# Patient Record
Sex: Female | Born: 1935 | Race: Black or African American | Hispanic: No
Health system: Southern US, Community
[De-identification: ages and names within clinical notes are randomized; demographics above are authoritative.]

## PROBLEM LIST (undated history)

## (undated) DIAGNOSIS — K219 Gastro-esophageal reflux disease without esophagitis: Secondary | ICD-10-CM

## (undated) DIAGNOSIS — F329 Major depressive disorder, single episode, unspecified: Secondary | ICD-10-CM

## (undated) DIAGNOSIS — I10 Essential (primary) hypertension: Secondary | ICD-10-CM

## (undated) DIAGNOSIS — F0390 Unspecified dementia without behavioral disturbance: Secondary | ICD-10-CM

## (undated) DIAGNOSIS — F32A Depression, unspecified: Secondary | ICD-10-CM

---

## 2012-02-12 ENCOUNTER — Encounter (HOSPITAL_COMMUNITY): Payer: Self-pay | Admitting: Emergency Medicine

## 2012-02-12 ENCOUNTER — Other Ambulatory Visit: Payer: Self-pay

## 2012-02-12 ENCOUNTER — Emergency Department (HOSPITAL_COMMUNITY)
Admission: EM | Admit: 2012-02-12 | Discharge: 2012-02-13 | Disposition: A | Discharge status: Skilled Nursing Facility | Payer: Medicare Other | Attending: Emergency Medicine | Admitting: Emergency Medicine

## 2012-02-12 ENCOUNTER — Other Ambulatory Visit (HOSPITAL_COMMUNITY): Payer: Self-pay | Admitting: Pharmacy Technician

## 2012-02-12 ENCOUNTER — Emergency Department (HOSPITAL_COMMUNITY): Payer: Medicare Other

## 2012-02-12 DIAGNOSIS — I10 Essential (primary) hypertension: Secondary | ICD-10-CM | POA: Insufficient documentation

## 2012-02-12 DIAGNOSIS — F3289 Other specified depressive episodes: Secondary | ICD-10-CM | POA: Insufficient documentation

## 2012-02-12 DIAGNOSIS — Z79899 Other long term (current) drug therapy: Secondary | ICD-10-CM | POA: Insufficient documentation

## 2012-02-12 DIAGNOSIS — F0390 Unspecified dementia without behavioral disturbance: Secondary | ICD-10-CM

## 2012-02-12 DIAGNOSIS — F068 Other specified mental disorders due to known physiological condition: Secondary | ICD-10-CM | POA: Insufficient documentation

## 2012-02-12 DIAGNOSIS — F329 Major depressive disorder, single episode, unspecified: Secondary | ICD-10-CM | POA: Insufficient documentation

## 2012-02-12 DIAGNOSIS — R45851 Suicidal ideations: Secondary | ICD-10-CM | POA: Insufficient documentation

## 2012-02-12 DIAGNOSIS — Z7982 Long term (current) use of aspirin: Secondary | ICD-10-CM | POA: Insufficient documentation

## 2012-02-12 HISTORY — DX: Unspecified dementia, unspecified severity, without behavioral disturbance, psychotic disturbance, mood disturbance, and anxiety: F03.90

## 2012-02-12 HISTORY — DX: Essential (primary) hypertension: I10

## 2012-02-12 LAB — COMPREHENSIVE METABOLIC PANEL
ALT: 33 U/L (ref 0–35)
Calcium: 9 mg/dL (ref 8.4–10.5)
Creatinine, Ser: 0.92 mg/dL (ref 0.50–1.10)
GFR calc Af Amer: 60 mL/min — ABNORMAL LOW (ref >90)
Glucose, Bld: 100 mg/dL — ABNORMAL HIGH (ref 70–99)
Sodium: 139 mEq/L (ref 135–145)
Total Protein: 7.4 g/dL (ref 6.0–8.3)

## 2012-02-12 LAB — CBC
MCH: 31.2 pg (ref 26.0–34.0)
MCV: 98.3 fL (ref 78.0–100.0)
Platelets: 246 10*3/uL (ref 150–400)
RBC: 3.59 MIL/uL — ABNORMAL LOW (ref 3.87–5.11)
RDW: 12.2 % (ref 11.5–15.5)

## 2012-02-12 LAB — ETHANOL: Alcohol, Ethyl (B): <11 mg/dL (ref 0–11)

## 2012-02-12 LAB — URINALYSIS, ROUTINE W REFLEX MICROSCOPIC
Bilirubin Urine: NEGATIVE
Ketones, ur: NEGATIVE mg/dL
Nitrite: NEGATIVE
Urobilinogen, UA: 1 mg/dL (ref 0.0–1.0)

## 2012-02-12 LAB — URINE MICROSCOPIC-ADD ON

## 2012-02-12 LAB — DIFFERENTIAL
Eosinophils Absolute: 0.1 10*3/uL (ref 0.0–0.7)
Eosinophils Relative: 2 % (ref 0–5)
Lymphs Abs: 1.5 10*3/uL (ref 0.7–4.0)
Monocytes Absolute: 0.5 10*3/uL (ref 0.1–1.0)

## 2012-02-12 MED ORDER — CLONIDINE HCL 0.1 MG PO TABS
0.1000 mg | ORAL_TABLET | Freq: Once | ORAL | Status: AC
  Administered 2012-02-13: 0.1 mg via ORAL
  Filled 2012-02-12: qty 1

## 2012-02-12 NOTE — ED Notes (Signed)
Patient transported to X-ray 

## 2012-02-12 NOTE — ED Notes (Signed)
Pt assisted to bedside commode

## 2012-02-12 NOTE — ED Notes (Signed)
EKG given to EDP, IVA KNAPP. 

## 2012-02-12 NOTE — ED Notes (Signed)
Pt with b/p of 195/94. md informed. Awaiting orders.

## 2012-02-12 NOTE — ED Provider Notes (Signed)
History     CSN: [id number]  Arrival date & time 02/12/12  1847   First MD Initiated Contact with Patient 02/12/12 1930      Chief Complaint  Patient presents with  . Suicidal  . Psychiatric Evaluation   Level 5 caveat for dementia  (Consider location/radiation/quality/duration/timing/severity/associated sxs/prior treatment) HPI  Patient denies having prior history of depression. When I ask her how she's doing she states she feels pretty good and states she's not having any pain. However she does states she's feeling very sad. She talks a lot about her children and specifically a son but does not tell me what he's done that has upset her. She states "it seems like you just get on your feet and the devil starts stepping in" when I asked her who the devil is she states "my children" patient states today she told her caregivers she was going to kill himself and states if she had had a gun she would've done it. She states it's time for her to get out of the way. Patient does seem to have some confusion. She told me she lives with her son who is a Chartered loss adjuster however she lives in a facility. She also talks about seeing her father last week who must have already been deceased.  PCP Dr Doreatha Martin  Past Medical History  Diagnosis Date  . Hypertension   . Dementia     No past surgical history on file.  No family history on file.  History  Substance Use Topics  . Smoking status: Not on file  . Smokeless tobacco: Not on file  . Alcohol Use:   Lives in skilled nursing facility  OB History    Grav Para Term Preterm Abortions TAB SAB Ect Mult Living                  Review of Systems  Unable to perform ROS   Allergies  Review of patient's allergies indicates no known allergies.  Home Medications   Current Outpatient Rx  Name Route Sig Dispense Refill  . ADULT MULTIVITAMIN W/MINERALS CH Oral Take 1 tablet by mouth daily.    . ASPIRIN EC 81 MG PO TBEC Oral Take 81  mg by mouth daily.    . DONEPEZIL HCL 10 MG PO TABS Oral Take 10 mg by mouth every morning.    Marland Kitchen MEMANTINE HCL 10 MG PO TABS Oral Take 10 mg by mouth 2 (two) times daily.    Marland Kitchen PINDOLOL 5 MG PO TABS Oral Take 2.5 mg by mouth 2 (two) times daily.      BP 193/97  Pulse 72  Resp 16  SpO2 98%  Vital signs normal except hypertension   Physical Exam  Constitutional: She appears well-developed and well-nourished.  Non-toxic appearance. She does not appear ill. No distress.  HENT:  Head: Normocephalic and atraumatic.  Right Ear: External ear normal.  Left Ear: External ear normal.  Nose: Nose normal. No mucosal edema or rhinorrhea.  Mouth/Throat: Oropharynx is clear and moist and mucous membranes are normal. No dental abscesses or uvula swelling.  Eyes: Conjunctivae and EOM are normal. Pupils are equal, round, and reactive to light.  Neck: Normal range of motion and full passive range of motion without pain. Neck supple.  Cardiovascular: Normal rate, regular rhythm and normal heart sounds.  Exam reveals no gallop and no friction rub.   No murmur heard. Pulmonary/Chest: Effort normal and breath sounds normal. No respiratory distress. She has no wheezes.  She has no rhonchi. She has no rales. She exhibits no tenderness and no crepitus.  Abdominal: Soft. Normal appearance and bowel sounds are normal. She exhibits no distension. There is no tenderness. There is no rebound and no guarding.  Musculoskeletal: Normal range of motion. She exhibits no edema and no tenderness.       Moves all extremities well.   Neurological: She is alert. She has normal strength. No cranial nerve deficit.       Patient does not know the year, patient cannot tell me where she lives.  Skin: Skin is warm, dry and intact. No rash noted. No erythema. No pallor.  Psychiatric: Her speech is normal and behavior is normal. Her mood appears not anxious.       Patient has flat affect and she starts crying when she talks about her  son.    ED Course  Procedures (including critical care time)  Telepsych ordered  22:18 Aurther Loft, ACT will see patient  Pt given one dose of clonidine for her HTN.   Telepsych consult done by Dr Reece Leader who recommends pt go back to her SNF and start on lexapro 5 mg a day and get outpatient psychiatric treatment.   Results for orders placed during the hospital encounter of 02/12/12  URINALYSIS, ROUTINE W REFLEX MICROSCOPIC      Component Value Range   Color, Urine YELLOW  YELLOW    APPearance CLEAR  CLEAR    Specific Gravity, Urine 1.010  1.005 - 1.030    pH 7.5  5.0 - 8.0    Glucose, UA NEGATIVE  NEGATIVE (mg/dL)   Hgb urine dipstick TRACE (*) NEGATIVE    Bilirubin Urine NEGATIVE  NEGATIVE    Ketones, ur NEGATIVE  NEGATIVE (mg/dL)   Protein, ur NEGATIVE  NEGATIVE (mg/dL)   Urobilinogen, UA 1.0  0.0 - 1.0 (mg/dL)   Nitrite NEGATIVE  NEGATIVE    Leukocytes, UA NEGATIVE  NEGATIVE   CBC      Component Value Range   WBC 6.8  4.0 - 10.5 (K/uL)   RBC 3.59 (*) 3.87 - 5.11 (MIL/uL)   Hemoglobin 11.2 (*) 12.0 - 15.0 (g/dL)   HCT 16.1 (*) 09.6 - 46.0 (%)   MCV 98.3  78.0 - 100.0 (fL)   MCH 31.2  26.0 - 34.0 (pg)   MCHC 31.7  30.0 - 36.0 (g/dL)   RDW 04.5  40.9 - 81.1 (%)   Platelets 246  150 - 400 (K/uL)  DIFFERENTIAL      Component Value Range   Neutrophils Relative 68  43 - 77 (%)   Neutro Abs 4.6  1.7 - 7.7 (K/uL)   Lymphocytes Relative 22  12 - 46 (%)   Lymphs Abs 1.5  0.7 - 4.0 (K/uL)   Monocytes Relative 8  3 - 12 (%)   Monocytes Absolute 0.5  0.1 - 1.0 (K/uL)   Eosinophils Relative 2  0 - 5 (%)   Eosinophils Absolute 0.1  0.0 - 0.7 (K/uL)   Basophils Relative 0  0 - 1 (%)   Basophils Absolute 0.0  0.0 - 0.1 (K/uL)  COMPREHENSIVE METABOLIC PANEL      Component Value Range   Sodium 139  135 - 145 (mEq/L)   Potassium 3.5  3.5 - 5.1 (mEq/L)   Chloride 100  96 - 112 (mEq/L)   CO2 30  19 - 32 (mEq/L)   Glucose, Bld 100 (*) 70 - 99 (mg/dL)   BUN 14  6 -  23 (mg/dL)    Creatinine, Ser 1.61  0.50 - 1.10 (mg/dL)   Calcium 9.0  8.4 - 09.6 (mg/dL)   Total Protein 7.4  6.0 - 8.3 (g/dL)   Albumin 3.3 (*) 3.5 - 5.2 (g/dL)   AST 30  0 - 37 (U/L)   ALT 33  0 - 35 (U/L)   Alkaline Phosphatase 104  39 - 117 (U/L)   Total Bilirubin 0.4  0.3 - 1.2 (mg/dL)   GFR calc non Af Amer 52 (*) >90 (mL/min)   GFR calc Af Amer 60 (*) >90 (mL/min)  ETHANOL      Component Value Range   Alcohol, Ethyl (B) <11  0 - 11 (mg/dL)  URINE MICROSCOPIC-ADD ON      Component Value Range   Squamous Epithelial / LPF RARE  RARE    WBC, UA 0-2  <3 (WBC/hpf)   RBC / HPF 0-2  <3 (RBC/hpf)   Bacteria, UA RARE  RARE    Laboratory interpretation all normal except mild anemia   Dg Chest 2 View  02/12/2012  *RADIOLOGY REPORT*  Clinical Data: Cough and weakness.  CHEST - 2 VIEW  Comparison: None  Findings: The heart size appears mildly enlarged.  There is no pleural effusion or edema identified.  The lungs are hyperinflated and there are coarsened interstitial markings which suggests COPD.  Nodular density in the right base is likely nipple artifact.  Mild spondylosis within the thoracic spine.  IMPRESSION:  1.  No acute cardiopulmonary abnormalities.  Original Report Authenticated By: Rosealee Albee, M.D.    Date: 02/12/2012  Rate: 73  Rhythm: normal sinus rhythm  QRS Axis: left  Intervals: normal  ST/T Wave abnormalities: normal  Conduction Disutrbances:none  Narrative Interpretation: PRWP  Old EKG Reviewed: none available     1. Depression   2. Suicidal ideation   3. Dementia   4. Hypertension    Plan per psychiatric consult pt is discharged to start lexapro 5 mg a day.   Devoria Albe, MD, FACEP    MDM          Ward Givens, MD 02/13/12 [phone]

## 2012-02-12 NOTE — ED Notes (Signed)
[id number]<AV> Expected date:<BR> Expected time:<BR> Means of arrival:<BR> Comments:<BR> Needs to be zapped

## 2012-02-12 NOTE — ED Notes (Signed)
Per EMS pt from [facility].  Per staff pt has said 3-4 times throughout the day that she has suicidal thoughts.  EMS states that she was crying on the truck on way to ED.  No other complaints

## 2012-02-12 NOTE — ED Notes (Signed)
[id number]<AV> Expected date:<BR> Expected time: 6:44 PM<BR> Means of arrival:<BR> Comments:<BR> M35 - 89yoF Suicidal, fr nursing home

## 2012-02-13 MED ORDER — ONDANSETRON HCL 4 MG PO TABS: 4.0000 mg | ORAL_TABLET | Freq: Three times a day (TID) | ORAL | Status: DC | PRN

## 2012-02-13 MED ORDER — PINDOLOL 5 MG PO TABS
2.5000 mg | ORAL_TABLET | Freq: Two times a day (BID) | ORAL | Status: DC
  Filled 2012-02-13: qty 1

## 2012-02-13 MED ORDER — DONEPEZIL HCL 10 MG PO TABS
10.0000 mg | ORAL_TABLET | Freq: Every day | ORAL | Status: DC
Start: 2012-02-13 — End: 2012-02-13

## 2012-02-13 MED ORDER — ESCITALOPRAM OXALATE 5 MG PO TABS: 5.0000 mg | ORAL_TABLET | Freq: Every day | ORAL | Status: AC

## 2012-02-13 MED ORDER — ACETAMINOPHEN 325 MG PO TABS: 650.0000 mg | ORAL_TABLET | ORAL | Status: DC | PRN

## 2012-02-13 MED ORDER — ADULT MULTIVITAMIN W/MINERALS CH: 1.0000 | ORAL_TABLET | Freq: Every day | ORAL | Status: DC

## 2012-02-13 MED ORDER — MEMANTINE HCL 10 MG PO TABS
10.0000 mg | ORAL_TABLET | Freq: Two times a day (BID) | ORAL | Status: DC
  Filled 2012-02-13: qty 1

## 2012-02-13 MED ORDER — ZOLPIDEM TARTRATE 5 MG PO TABS: 5.0000 mg | ORAL_TABLET | Freq: Every evening | ORAL | Status: DC | PRN

## 2012-02-13 MED ORDER — LORAZEPAM 0.5 MG PO TABS: 0.5000 mg | ORAL_TABLET | Freq: Three times a day (TID) | ORAL | Status: DC | PRN

## 2012-02-13 MED ORDER — ASPIRIN EC 81 MG PO TBEC: 81.0000 mg | DELAYED_RELEASE_TABLET | Freq: Every day | ORAL | Status: DC

## 2012-02-13 MED ORDER — NICOTINE 21 MG/24HR TD PT24: 21.0000 mg | MEDICATED_PATCH | Freq: Every day | TRANSDERMAL | Status: DC

## 2012-02-13 MED ORDER — ALUM & MAG HYDROXIDE-SIMETH 200-200-20 MG/5ML PO SUSP: 30.0000 mL | ORAL | Status: DC | PRN

## 2012-02-13 NOTE — ED Notes (Signed)
Called specialist on call answering service awaiting call back from MD>

## 2012-02-13 NOTE — ED Notes (Signed)
Awaiting ptar to transport pt back to [facility].

## 2012-02-13 NOTE — ED Notes (Signed)
Pt with telepscych conference complete.

## 2012-02-13 NOTE — Discharge Instructions (Signed)
You have been evaluated by a psychiatrist tonight who recomends you start taking lexapro 5 mg a day. You also need to be seen at [facility] and see a psychiatrist as an outpatient to manage your depression.

## 2012-02-13 NOTE — BH Assessment (Signed)
Assessment Note   Carly Wilkinson is a 78 y.o. female who presents at the request of pt.'s nursing facility, pt allegedly made statements that she was SI, no plan.  Pt has dementia, no evidence of any SI with intent to harm.  Pt has no previous hx of inpt/oupt psych hx, currently lives in skilled nursing facility.  Pt. Was unsure as to why she is in the emergency department---"why am I here, I feel fine".  Telepsych was completed for patient.  Pt will be d/c'd back to nursing facility with referrals for outpatient services.     Axis I: Dementia  Axis II: Deferred Axis III:  Past Medical History  Diagnosis Date  . Hypertension   . Dementia    Axis IV: other psychosocial or environmental problems Axis V: 31-40 impairment in reality testing  Past Medical History:  Past Medical History  Diagnosis Date  . Hypertension   . Dementia     No past surgical history on file.  Family History: No family history on file.  Social History:  does not have a smoking history on file. She does not have any smokeless tobacco history on file. Her alcohol and drug histories not on file.  Additional Social History:  Alcohol / Drug Use Pain Medications: None  Prescriptions: None  Over the Counter: None  History of alcohol / drug use?: No history of alcohol / drug abuse Longest period of sobriety (when/how long): None  Allergies: No Known Allergies  Home Medications:  Medications Prior to Admission  Medication Sig Dispense Refill  . escitalopram (LEXAPRO) 5 MG tablet Take 1 tablet (5 mg total) by mouth daily.  30 tablet  0   Medications Prior to Admission  Medication Dose Route Frequency Provider Last Rate Last Dose  . acetaminophen (TYLENOL) tablet 650 mg  650 mg Oral Q4H PRN Ward Givens, MD      . alum & mag hydroxide-simeth (MAALOX/MYLANTA) 200-200-20 MG/5ML suspension 30 mL  30 mL Oral PRN Ward Givens, MD      . aspirin EC tablet 81 mg  81 mg Oral Daily Ward Givens, MD      . cloNIDine (CATAPRES)  tablet 0.1 mg  0.1 mg Oral Once Ward Givens, MD   0.1 mg at 02/13/12 0001  . donepezil (ARICEPT) tablet 10 mg  10 mg Oral Daily Ward Givens, MD      . LORazepam (ATIVAN) tablet 0.5 mg  0.5 mg Oral Q8H PRN Ward Givens, MD      . memantine [facility]) tablet 10 mg  10 mg Oral BID Ward Givens, MD      . mulitivitamin with minerals tablet 1 tablet  1 tablet Oral Daily Ward Givens, MD      . nicotine (NICODERM CQ - dosed in mg/24 hours) patch 21 mg  21 mg Transdermal Daily Ward Givens, MD      . ondansetron (ZOFRAN) tablet 4 mg  4 mg Oral Q8H PRN Ward Givens, MD      . pindolol (VISKEN) tablet 2.5 mg  2.5 mg Oral BID Ward Givens, MD      . zolpidem (AMBIEN) tablet 5 mg  5 mg Oral QHS PRN Ward Givens, MD        OB/GYN Status:  No LMP recorded.  General Assessment Data Location of Assessment: [facility] ED Living Arrangements: Assisted Living Facility Can pt return to current living arrangement?: Yes Admission Status: Voluntary Is patient  capable of signing voluntary admission?: No Transfer from: Acute Hospital Referral Source: MD  Education Status Is patient currently in school?: No Current Grade: None  Highest grade of school patient has completed: None  Name of school: None  Contact person: None   Risk to self Suicidal Ideation: No Suicidal Intent: No Is patient at risk for suicide?: No Suicidal Plan?: No Access to Means: No What has been your use of drugs/alcohol within the last 12 months?: None  Previous Attempts/Gestures: No How many times?: 0  Other Self Harm Risks: None  Triggers for Past Attempts: None known Intentional Self Injurious Behavior: None Family Suicide History: No Recent stressful life event(s): Other (Comment) (Dementia ) Persecutory voices/beliefs?: No Depression: No Depression Symptoms:  (None reported ) Substance abuse history and/or treatment for substance abuse?: No Suicide prevention information given to non-admitted patients: Not applicable  Risk to  Others Homicidal Ideation: No Thoughts of Harm to Others: No Current Homicidal Intent: No Current Homicidal Plan: No Access to Homicidal Means: No Identified Victim: None  History of harm to others?: No Assessment of Violence: None Noted Violent Behavior Description: None  Does patient have access to weapons?: No Criminal Charges Pending?: No Does patient have a court date: No  Psychosis Hallucinations: None noted Delusions: None noted  Mental Status Report Appear/Hygiene: Other (Comment) (Appropriate ) Eye Contact: Good Motor Activity: Unremarkable Speech: Incoherent Level of Consciousness: Alert Mood: Other (Comment) (Appropriate ) Affect: Appropriate to circumstance Anxiety Level: None Thought Processes: Irrelevant Judgement: Unimpaired Orientation: Person;Place;Time;Situation Obsessive Compulsive Thoughts/Behaviors: None  Cognitive Functioning Concentration: Normal Memory: Recent Impaired;Remote Impaired IQ: Average Insight: Fair Impulse Control: Fair Appetite: Fair Weight Loss: 0  Weight Gain: 0  Sleep: No Change Total Hours of Sleep: 8  Vegetative Symptoms: None  Prior Inpatient Therapy Prior Inpatient Therapy: No Prior Therapy Dates: None  Prior Therapy Facilty/Provider(s): None  Reason for Treatment: None   Prior Outpatient Therapy Prior Outpatient Therapy: No Prior Therapy Dates: None  Prior Therapy Facilty/Provider(s): None  Reason for Treatment: None   ADL Screening (condition at time of admission) Patient's cognitive ability adequate to safely complete daily activities?: Yes Patient able to express need for assistance with ADLs?: Yes Independently performs ADLs?:  (Pt may need minimal assitance ) Weakness of Legs: None Weakness of Arms/Hands: None       Abuse/Neglect Assessment (Assessment to be complete while patient is alone) Physical Abuse: Denies Verbal Abuse: Denies Sexual Abuse: Denies Exploitation of patient/patient's  resources: Denies Self-Neglect: Denies Values / Beliefs Cultural Requests During Hospitalization: None Spiritual Requests During Hospitalization: None Consults Spiritual Care Consult Needed: No Social Work Consult Needed: No Merchant navy officer (For Healthcare) Advance Directive: Patient does not have advance directive (Unk if patient has advance directive )    Additional Information 1:1 In Past 12 Months?: No CIRT Risk: No Elopement Risk: No Does patient have medical clearance?: Yes     Disposition:  Disposition Disposition of Patient: Referred to;Outpatient treatment Patient referred to: Other (Comment) (Outpatient services through the Nursing Facility )  On Site Evaluation by:   Reviewed with Physician:     Murrell Redden 02/13/2012 4:43 AM

## 2016-08-13 ENCOUNTER — Emergency Department (HOSPITAL_BASED_OUTPATIENT_CLINIC_OR_DEPARTMENT_OTHER): Payer: Medicare Other

## 2016-08-13 ENCOUNTER — Encounter (HOSPITAL_BASED_OUTPATIENT_CLINIC_OR_DEPARTMENT_OTHER): Payer: Self-pay | Admitting: Emergency Medicine

## 2016-08-13 ENCOUNTER — Emergency Department (HOSPITAL_BASED_OUTPATIENT_CLINIC_OR_DEPARTMENT_OTHER)
Admission: EM | Admit: 2016-08-13 | Discharge: 2016-08-13 | Disposition: A | Discharge status: Skilled Nursing Facility | Payer: Medicare Other | Attending: Emergency Medicine | Admitting: Emergency Medicine

## 2016-08-13 DIAGNOSIS — Y999 Unspecified external cause status: Secondary | ICD-10-CM | POA: Diagnosis not present

## 2016-08-13 DIAGNOSIS — I10 Essential (primary) hypertension: Secondary | ICD-10-CM | POA: Insufficient documentation

## 2016-08-13 DIAGNOSIS — Y939 Activity, unspecified: Secondary | ICD-10-CM | POA: Insufficient documentation

## 2016-08-13 DIAGNOSIS — Z7982 Long term (current) use of aspirin: Secondary | ICD-10-CM | POA: Diagnosis not present

## 2016-08-13 DIAGNOSIS — W19XXXA Unspecified fall, initial encounter: Secondary | ICD-10-CM | POA: Insufficient documentation

## 2016-08-13 DIAGNOSIS — D649 Anemia, unspecified: Secondary | ICD-10-CM | POA: Diagnosis not present

## 2016-08-13 DIAGNOSIS — M25562 Pain in left knee: Secondary | ICD-10-CM

## 2016-08-13 DIAGNOSIS — Z79891 Long term (current) use of opiate analgesic: Secondary | ICD-10-CM | POA: Insufficient documentation

## 2016-08-13 DIAGNOSIS — Z79899 Other long term (current) drug therapy: Secondary | ICD-10-CM | POA: Diagnosis not present

## 2016-08-13 DIAGNOSIS — I6782 Cerebral ischemia: Secondary | ICD-10-CM | POA: Diagnosis not present

## 2016-08-13 DIAGNOSIS — Y92002 Bathroom of unspecified non-institutional (private) residence single-family (private) house as the place of occurrence of the external cause: Secondary | ICD-10-CM | POA: Insufficient documentation

## 2016-08-13 DIAGNOSIS — M25531 Pain in right wrist: Secondary | ICD-10-CM

## 2016-08-13 LAB — CBC WITH DIFFERENTIAL/PLATELET
Basophils Absolute: 0 10*3/uL (ref 0.0–0.1)
Basophils Relative: 0 %
EOS PCT: 1 %
Eosinophils Absolute: 0.1 10*3/uL (ref 0.0–0.7)
HEMATOCRIT: 33.7 % — AB (ref 36.0–46.0)
Hemoglobin: 10.5 g/dL — ABNORMAL LOW (ref 12.0–15.0)
LYMPHS ABS: 1.2 10*3/uL (ref 0.7–4.0)
LYMPHS PCT: 11 %
MCH: 29.9 pg (ref 26.0–34.0)
MCHC: 31.2 g/dL (ref 30.0–36.0)
MCV: 96 fL (ref 78.0–100.0)
MONO ABS: 0.9 10*3/uL (ref 0.1–1.0)
MONOS PCT: 9 %
NEUTROS ABS: 8.3 10*3/uL — AB (ref 1.7–7.7)
Neutrophils Relative %: 79 %
PLATELETS: 271 10*3/uL (ref 150–400)
RBC: 3.51 MIL/uL — ABNORMAL LOW (ref 3.87–5.11)
RDW: 13.4 % (ref 11.5–15.5)
WBC: 10.5 10*3/uL (ref 4.0–10.5)

## 2016-08-13 LAB — URINE MICROSCOPIC-ADD ON

## 2016-08-13 LAB — URINALYSIS, ROUTINE W REFLEX MICROSCOPIC
Bilirubin Urine: NEGATIVE
GLUCOSE, UA: NEGATIVE mg/dL
KETONES UR: NEGATIVE mg/dL
LEUKOCYTES UA: NEGATIVE
Nitrite: NEGATIVE
PH: 5 (ref 5.0–8.0)
PROTEIN: 30 mg/dL — AB
Specific Gravity, Urine: 1.019 (ref 1.005–1.030)

## 2016-08-13 LAB — BASIC METABOLIC PANEL
ANION GAP: 8 (ref 5–15)
BUN: 28 mg/dL — AB (ref 6–20)
CHLORIDE: 112 mmol/L — AB (ref 101–111)
CO2: 26 mmol/L (ref 22–32)
Calcium: 8.7 mg/dL — ABNORMAL LOW (ref 8.9–10.3)
Creatinine, Ser: 1.01 mg/dL — ABNORMAL HIGH (ref 0.44–1.00)
GFR calc Af Amer: 52 mL/min — ABNORMAL LOW (ref >60)
GFR calc non Af Amer: 45 mL/min — ABNORMAL LOW (ref >60)
GLUCOSE: 132 mg/dL — AB (ref 65–99)
POTASSIUM: 3.8 mmol/L (ref 3.5–5.1)
Sodium: 146 mmol/L — ABNORMAL HIGH (ref 135–145)

## 2016-08-13 LAB — OCCULT BLOOD X 1 CARD TO LAB, STOOL: FECAL OCCULT BLD: NEGATIVE

## 2016-08-13 NOTE — ED Notes (Signed)
Patient transported to CT 

## 2016-08-13 NOTE — ED Triage Notes (Addendum)
EMS reports "staff found patient in the floor".  States that staff is assuming she fell.  Staff reports acting normal but complaints of left hip and knee pain.  History of dementia.  [facility] [place].  Family out of state.  Nurse at [facility] left phone number to reach facility [phone]

## 2016-08-13 NOTE — ED Notes (Signed)
C-collar removed -- verbal order received from PA-C

## 2016-08-13 NOTE — ED Provider Notes (Signed)
[facility] Provider Note   CSN: [id number] Arrival date & time: 08/13/16  1458     History   Chief Complaint Chief Complaint  Patient presents with  . Fall    HPI Carly Wilkinson is a 82 y.o. female.  82 year old past medical history significant for dementia and hypertension presents to the ED today after fall at nursing facility prior to arrival. Per staff at nursing facility patient was found on the floor in the bathroom with her head under the sink with urine on the ground. Per staff patient was acting normal but she did complain of left hip and left knee pain. Unsure if patient hit her head. Per nursing staff patient was started on Levaquin approximately 3 days ago for possible pneumonia. She is currently taking antibiotics. Nursing staff says that patient took all her medications this morning. They state she has been feeling well and no other complaints. Difficult to obtain history from patient due to demential. Unsure if this is baseline for patient. States that she isn't sure if she hit her head. Denies any dizziness or lightheadedness prior to the fall. Patient states "she had her slippery slippers on." Patient denies any pain at this time. She has no complaints.   The history is provided by the patient and the nursing home. The history is limited by the condition of the patient.  Fall  Pertinent negatives include no chest pain, no abdominal pain, no headaches and no shortness of breath.    Past Medical History:  Diagnosis Date  . Dementia   . Hypertension     There are no active problems to display for this patient.   No past surgical history on file.  OB History    No data available       Home Medications    Prior to Admission medications   Medication Sig Start Date End Date Taking? Authorizing Provider  acetaminophen (TYLENOL) 325 MG tablet Take 650 mg by mouth every 4 (four) hours as needed.   Yes Historical Provider, MD  amLODipine-benazepril  (LOTREL) 5-10 MG capsule Take 1 capsule by mouth daily.   Yes Historical Provider, MD  escitalopram (LEXAPRO) 5 MG tablet Take 5 mg by mouth daily.   Yes Historical Provider, MD  ipratropium-albuterol (DUONEB) 0.5-2.5 (3) MG/3ML SOLN Take 3 mLs by nebulization every 8 (eight) hours as needed.   Yes Historical Provider, MD  loratadine (CLARITIN) 10 MG tablet Take 10 mg by mouth daily.   Yes Historical Provider, MD  aspirin EC 81 MG tablet Take 81 mg by mouth daily.    Historical Provider, MD  donepezil (ARICEPT) 10 MG tablet Take 10 mg by mouth every morning.    Historical Provider, MD  memantine (NAMENDA) 10 MG tablet Take 10 mg by mouth 2 (two) times daily.    Historical Provider, MD  Multiple Vitamin (MULITIVITAMIN WITH MINERALS) TABS Take 1 tablet by mouth daily.    Historical Provider, MD  pindolol (VISKEN) 5 MG tablet Take 2.5 mg by mouth 2 (two) times daily.    Historical Provider, MD    Family History No family history on file.  Social History Social History  Substance Use Topics  . Smoking status: Unknown If Ever Smoked  . Smokeless tobacco: Not on file  . Alcohol use Not on file     Allergies   Review of patient's allergies indicates no known allergies.   Review of Systems Review of Systems  Constitutional: Negative for chills and fever.  HENT:  Negative for congestion and sore throat.   Respiratory: Negative for cough and shortness of breath.   Cardiovascular: Negative for chest pain and palpitations.  Gastrointestinal: Negative for abdominal pain, diarrhea, nausea and vomiting.  Genitourinary: Negative for dysuria, flank pain and urgency.  Musculoskeletal: Negative.  Negative for neck pain.  Skin: Negative.   Neurological: Negative for dizziness, light-headedness and headaches.     Physical Exam Updated Vital Signs BP 137/86 (BP Location: Right Arm)   Pulse 71   Temp 98.3 F (36.8 C) (Oral)   Resp 15   Wt 68 kg   SpO2 92%   Physical Exam  Constitutional:  She appears well-developed and well-nourished. No distress.  HENT:  Head: Normocephalic and atraumatic.  Mouth/Throat: Oropharynx is clear and moist.  Eyes: Conjunctivae are normal. Pupils are equal, round, and reactive to light. Right eye exhibits no discharge. Left eye exhibits no discharge. No scleral icterus.  Neck: Normal range of motion. Neck supple. No thyromegaly present.  Patient in C-collar. No midline tenderness.  Cardiovascular: Normal rate, regular rhythm, normal heart sounds and intact distal pulses.   Pulses:      Radial pulses are 2+ on the right side, and 2+ on the left side.       Dorsalis pedis pulses are 2+ on the right side, and 2+ on the left side.  Pulmonary/Chest: Effort normal and breath sounds normal.  Abdominal: Soft. Bowel sounds are normal. She exhibits no distension. There is no tenderness. There is no rebound and no guarding.  Musculoskeletal: Normal range of motion.  Patient has not complaints of pain. Pelvis stable. Radial and DP pulses 2+ bilaterally. Sensation intact. Cap refill normal. Denies any pain to palpation.  Lymphadenopathy:    She has no cervical adenopathy.  Neurological: She is alert. No sensory deficit. GCS eye subscore is 4. GCS verbal subscore is 5. GCS motor subscore is 6.  Patient unable to get accurate exam due to dementia. Not sure if this is baseline for patient. Denies any complaints at this time. Oriented to person and place.  Skin: Skin is warm and dry. Capillary refill takes less than 2 seconds.  Nursing note and vitals reviewed.     ED Treatments / Results  Labs (all labs ordered are listed, but only abnormal results are displayed) Labs Reviewed  BASIC METABOLIC PANEL - Abnormal; Notable for the following:       Result Value   Sodium 146 (*)    Chloride 112 (*)    Glucose, Bld 132 (*)    BUN 28 (*)    Creatinine, Ser 1.01 (*)    Calcium 8.7 (*)    GFR calc non Af Amer 45 (*)    GFR calc Af Amer 52 (*)    All other  components within normal limits  CBC WITH DIFFERENTIAL/PLATELET - Abnormal; Notable for the following:    RBC 3.51 (*)    Hemoglobin 10.5 (*)    HCT 33.7 (*)    Neutro Abs 8.3 (*)    All other components within normal limits  URINALYSIS, ROUTINE W REFLEX MICROSCOPIC (NOT AT [facility]) - Abnormal; Notable for the following:    Hgb urine dipstick TRACE (*)    Protein, ur 30 (*)    All other components within normal limits  URINE MICROSCOPIC-ADD ON - Abnormal; Notable for the following:    Squamous Epithelial / LPF 0-5 (*)    Bacteria, UA FEW (*)    Casts WBC CAST (*)  All other components within normal limits  OCCULT BLOOD X 1 CARD TO LAB, STOOL  POC OCCULT BLOOD, ED    EKG  EKG Interpretation None       Radiology Dg Wrist Complete Right  Result Date: 08/13/2016 CLINICAL DATA:  Found on floor at nursing facility, presumed fall, RIGHT posterior wrist bruising, history dementia, hypertension EXAM: RIGHT WRIST - COMPLETE 3+ VIEW COMPARISON:  None FINDINGS: Osseous demineralization. Chondrocalcinosis. Cystic change at lunate. Scattered intercarpal joint space narrowing. No definite acute fracture, dislocation, or bone destruction. Dorsal soft tissue swelling. IMPRESSION: Osseous demineralization with degenerative changes and question CPPD RIGHT wrist. No definite acute bony abnormalities. Electronically Signed   By: Ulyses SouthwardMark  Boles M.D.   On: 08/13/2016 16:35   Ct Head Wo Contrast  Result Date: 08/13/2016 CLINICAL DATA:  Patient from a nursing facility. Found on floor. History of dementia. Patient denies head pain or neck pain. EXAM: CT HEAD WITHOUT CONTRAST TECHNIQUE: Contiguous axial images were obtained from the base of the skull through the vertex without intravenous contrast. COMPARISON:  None. FINDINGS: The patient was scanned in the lateral decubitus position. Some images were normalized to aid interpretation. Brain: No evidence for acute infarction, hemorrhage, mass lesion,  hydrocephalus, or extra-axial fluid. Global atrophy. Hydrocephalus ex vacuo. Vascular: Advanced vascular calcification. No signs of large vessel occlusion. Skull: Normal. Negative for fracture or focal lesion. Sinuses/Orbits: Layering fluid in the RIGHT maxillary sinus, unlikely to be posttraumatic, probably inflammatory. Chronic mucosal thickening in the LEFT maxillary sinus. Periodontal disease. TMJs located. Other: None. IMPRESSION: Global atrophy with chronic microvascular ischemic change. No acute intracranial findings. No evidence for skull fracture or intracranial hemorrhage. Advanced vascular calcification. Probable acute RIGHT maxillary sinusitis. Electronically Signed   By: Elsie StainJohn T Curnes M.D.   On: 08/13/2016 16:28   Ct Cervical Spine Wo Contrast  Result Date: 08/13/2016 CLINICAL DATA:  82 year old female with unwitnessed fall. Altered mental status. EXAM: CT CERVICAL SPINE WITHOUT CONTRAST TECHNIQUE: Multidetector CT imaging of the cervical spine was performed without intravenous contrast. Multiplanar CT image reconstructions were also generated. COMPARISON:  None. FINDINGS: Alignment: Unremarkable.  No acute subluxation identified. Skull base and vertebrae: No acute fracture. Visualized brain is unremarkable. Soft tissues and spinal canal: No prevertebral soft tissue swelling identified. No significant soft tissue abnormalities identified. Disc levels: Moderate-severe multilevel degenerative disc disease and spondylosis noted as well as facet arthropathy. Other: None IMPRESSION: No evidence of acute abnormality. Multilevel degenerative changes. Electronically Signed   By: Harmon PierJeffrey  Hu M.D.   On: 08/13/2016 16:24   Dg Knee Complete 4 Views Left  Result Date: 08/13/2016 CLINICAL DATA:  Found on floor at nursing facility, presumed fall, RIGHT posterior wrist bruising, history dementia, hypertension EXAM: LEFT KNEE - COMPLETE 4+ VIEW COMPARISON:  None FINDINGS: Osseous demineralization.  Tricompartmental osteoarthritic changes with joint space narrowing and spur formation greatest at the medial compartment. Minimal chondrocalcinosis. No acute fracture, dislocation or bone destruction. No knee joint effusion. IMPRESSION: Osseous demineralization with degenerative changes and question CPPD LEFT knee. No acute abnormalities. Electronically Signed   By: Ulyses SouthwardMark  Boles M.D.   On: 08/13/2016 16:34   Dg Hips Bilat With Pelvis 3-4 Views  Result Date: 08/13/2016 CLINICAL DATA:  Found on floor at nursing facility, presumed fall, RIGHT posterior wrist bruising, history dementia, hypertension EXAM: DG HIP (WITH OR WITHOUT PELVIS) 3-4V BILAT COMPARISON:  None FINDINGS: Osseous demineralization. Hip and SI joint spaces symmetric and preserved. Pain no acute fracture, dislocation, or bone destruction. Degenerative disc and facet  disease changes at visualized lower lumbar spine. Scattered atherosclerotic calcifications. Bowel gas projects over the medial aspect of the RIGHT inferior pubic ramus, likely within rectum on exam rotated to LEFT though difficult to exclude small RIGHT obturator hernia. IMPRESSION: Osseous demineralization without acute bony abnormalities. Electronically Signed   By: Ulyses Southward M.D.   On: 08/13/2016 16:30    Procedures Procedures (including critical care time)  Medications Ordered in ED Medications - No data to display   Initial Impression / Assessment and Plan / ED Course  I have reviewed the triage vital signs and the nursing notes.  Pertinent labs & imaging results that were available during my care of the patient were reviewed by me and considered in my medical decision making (see chart for details).  Clinical Course  Elderly patient with dementia. Fall at nursing home. At baseline mental status. No signs of injury on PE. No blood thinners. Imaging without acute findings. Chronic finding noted. Labs are unchanged from workup at [facility] on the 9/8. UA without  infection. Blood and protein noted which was present on prior ua. EKG reviewed by Dr. Adela Lank without acute findings. Creatine and BUN elevated however decreased from blood work on 9/8. Hgb today was 10.5. Hgb on 9/8 was 11.8. Hemoccult was negative. Patient needs recheck of blood work including Hgb and kidney function this week by PCP with further workup as indicated. Patient without any complaints. Resting comfortably in the stretcher. Appears safe to return to SNF. Patient seen and examined by Dr. Adela Lank who agree with plan. Nurse given instruction to give to nursing home for follow up and strict return precautions. Discharged to SNF with stable vs and in NAD. Hemodynamically stable.   Final Clinical Impressions(s) / ED Diagnoses   Final diagnoses:  Fall, initial encounter  Right wrist pain  Left knee pain  Anemia, unspecified anemia type    New Prescriptions New Prescriptions   No medications on file     Rise Mu, PA-C 08/14/16 0906    Melene Plan, DO 08/16/16 1513

## 2016-08-13 NOTE — Discharge Instructions (Signed)
Patient needs recheck of hemoglobin and kidney function this week by PCP with further workup as indicated. Please return to the ED if your symptoms worsen.

## 2016-08-21 ENCOUNTER — Emergency Department (HOSPITAL_BASED_OUTPATIENT_CLINIC_OR_DEPARTMENT_OTHER)
Admission: EM | Admit: 2016-08-21 | Discharge: 2016-08-21 | Disposition: A | Discharge status: Home / Self Care | Payer: Medicare Other | Attending: Emergency Medicine | Admitting: Emergency Medicine

## 2016-08-21 ENCOUNTER — Encounter (HOSPITAL_BASED_OUTPATIENT_CLINIC_OR_DEPARTMENT_OTHER): Payer: Self-pay | Admitting: *Deleted

## 2016-08-21 ENCOUNTER — Emergency Department (HOSPITAL_BASED_OUTPATIENT_CLINIC_OR_DEPARTMENT_OTHER): Payer: Medicare Other

## 2016-08-21 DIAGNOSIS — F0390 Unspecified dementia without behavioral disturbance: Secondary | ICD-10-CM | POA: Insufficient documentation

## 2016-08-21 DIAGNOSIS — Z79899 Other long term (current) drug therapy: Secondary | ICD-10-CM | POA: Diagnosis not present

## 2016-08-21 DIAGNOSIS — Y92121 Bathroom in nursing home as the place of occurrence of the external cause: Secondary | ICD-10-CM | POA: Diagnosis not present

## 2016-08-21 DIAGNOSIS — W19XXXA Unspecified fall, initial encounter: Secondary | ICD-10-CM | POA: Insufficient documentation

## 2016-08-21 DIAGNOSIS — S022XXA Fracture of nasal bones, initial encounter for closed fracture: Secondary | ICD-10-CM | POA: Diagnosis not present

## 2016-08-21 DIAGNOSIS — I10 Essential (primary) hypertension: Secondary | ICD-10-CM | POA: Diagnosis not present

## 2016-08-21 DIAGNOSIS — Y999 Unspecified external cause status: Secondary | ICD-10-CM | POA: Insufficient documentation

## 2016-08-21 DIAGNOSIS — Y939 Activity, unspecified: Secondary | ICD-10-CM | POA: Insufficient documentation

## 2016-08-21 DIAGNOSIS — S0990XA Unspecified injury of head, initial encounter: Secondary | ICD-10-CM | POA: Diagnosis present

## 2016-08-21 NOTE — ED Triage Notes (Signed)
Per EMS:  Pt from [facility]  Was found on the floor in bathroom with a nose bleed.  Tampon applied to right nare by staff.  No bleeding out of tampon.  Pt denies pain.  Found on floor.

## 2016-08-21 NOTE — ED Provider Notes (Addendum)
[facility] Provider Note   CSN: [id number] Arrival date & time: 08/21/16  1402 By signing my name below, I, [name], attest that this documentation has been prepared under the direction and in the presence of Tomasita Crumble, MD. Electronically Signed: [name], ED Scribe. 08/21/16. 3:24 PM.  History   Chief Complaint Chief Complaint  Patient presents with  . Fall  LEVEL 5 CAVEAT: DEMENTIA HPI Comments: Carly Wilkinson is a 82 y.o. female with h/o dementia and HTN who presents to the Emergency Department by EMS from [facility] for evaluation s/p unwitnessed mechanical fall earlier today. Per EMS, pt was found on the floor in the bathroom with a nosebleed. Tampon was applied by staff to control bleeding. She denies any pain at this time.   The history is provided by the patient and the EMS personnel. No language interpreter was used.    Past Medical History:  Diagnosis Date  . Dementia   . Hypertension     There are no active problems to display for this patient.   History reviewed. No pertinent surgical history.  OB History    No data available     Home Medications    Prior to Admission medications   Medication Sig Start Date End Date Taking? Authorizing Provider  acetaminophen (TYLENOL) 325 MG tablet Take 650 mg by mouth every 4 (four) hours as needed.    Historical Provider, MD  amLODipine-benazepril (LOTREL) 5-10 MG capsule Take 1 capsule by mouth daily.    Historical Provider, MD  aspirin EC 81 MG tablet Take 81 mg by mouth daily.    Historical Provider, MD  calcium citrate-vitamin D (CITRACAL+D) 315-200 MG-UNIT tablet Take 1 tablet by mouth 2 (two) times daily.    Historical Provider, MD  donepezil (ARICEPT) 10 MG tablet Take 10 mg by mouth every morning.    Historical Provider, MD  escitalopram (LEXAPRO) 5 MG tablet Take 5 mg by mouth daily.    Historical Provider, MD  guaiFENesin (ROBITUSSIN) 100 MG/5ML SOLN Take 5 mLs by mouth every 4 (four) hours as needed  for cough or to loosen phlegm.    Historical Provider, MD  ipratropium-albuterol (DUONEB) 0.5-2.5 (3) MG/3ML SOLN Take 3 mLs by nebulization every 8 (eight) hours as needed.    Historical Provider, MD  levETIRAcetam (KEPPRA) 250 MG tablet Take 250 mg by mouth 2 (two) times daily.    Historical Provider, MD  levofloxacin (LEVAQUIN) 500 MG tablet Take 500 mg by mouth daily.    Historical Provider, MD  loratadine (CLARITIN) 10 MG tablet Take 10 mg by mouth daily.    Historical Provider, MD  memantine (NAMENDA) 10 MG tablet Take 10 mg by mouth 2 (two) times daily.    Historical Provider, MD  Multiple Vitamin (MULITIVITAMIN WITH MINERALS) TABS Take 1 tablet by mouth daily.    Historical Provider, MD  Multiple Vitamins-Iron (MULTIVITAMINS WITH IRON) TABS tablet Take 1 tablet by mouth daily.    Historical Provider, MD  neomycin-polymyxin b-dexamethasone (MAXITROL) 3.5-10000-0.1 OINT 1 application.    Historical Provider, MD  omeprazole (PRILOSEC) 10 MG capsule Take 10 mg by mouth daily.    Historical Provider, MD  pindolol (VISKEN) 5 MG tablet Take 2.5 mg by mouth 2 (two) times daily.    Historical Provider, MD    Family History History reviewed. No pertinent family history.  Social History Social History  Substance Use Topics  . Smoking status: Unknown If Ever Smoked  . Smokeless tobacco: Not on file  .  Alcohol use Not on file     Allergies   Review of patient's allergies indicates no known allergies.   Review of Systems Review of Systems  Unable to perform ROS: Dementia   Physical Exam Updated Vital Signs BP (!) 141/104 (BP Location: Right Arm)   Pulse 61   Temp 97.9 F (36.6 C) (Oral)   Resp 20   SpO2 93%   Physical Exam  Constitutional: She appears well-developed and well-nourished. No distress.  HENT:  Head: Normocephalic.  Mouth/Throat: Oropharynx is clear and moist. No oropharyngeal exudate.  Swollen nasal bridge with TTP. Tampon in right nare with hemostasis.   Eyes:  Conjunctivae and EOM are normal. Pupils are equal, round, and reactive to light. No scleral icterus.  Neck: Normal range of motion. Neck supple. No JVD present. No tracheal deviation present. No thyromegaly present.  Cardiovascular: Normal rate, regular rhythm and normal heart sounds.  Exam reveals no gallop and no friction rub.   No murmur heard. Pulmonary/Chest: Effort normal and breath sounds normal. No respiratory distress. She has no wheezes. She exhibits no tenderness.  Abdominal: Soft. Bowel sounds are normal. She exhibits no distension and no mass. There is no tenderness. There is no rebound and no guarding.  Musculoskeletal: Normal range of motion. She exhibits no edema or tenderness.  Lymphadenopathy:    She has no cervical adenopathy.  Neurological: She is alert. No cranial nerve deficit. She exhibits normal muscle tone.  Skin: Skin is warm and dry. No rash noted. No erythema. No pallor.  Nursing note and vitals reviewed.    ED Treatments / Results  DIAGNOSTIC STUDIES: Oxygen Saturation is 93% on RA, poor by my interpretation.   COORDINATION OF CARE: 3:14 PM Discussed treatment plan with pt at bedside and pt agreed to plan.  Labs (all labs ordered are listed, but only abnormal results are displayed) Labs Reviewed - No data to display  EKG  EKG Interpretation None       Radiology Ct Head Wo Contrast  Result Date: 08/21/2016 CLINICAL DATA:  82 year old female status post fall at nursing home, found down with bloody nose. Initial encounter. EXAM: CT HEAD WITHOUT CONTRAST CT MAXILLOFACIAL WITHOUT CONTRAST CT CERVICAL SPINE WITHOUT CONTRAST TECHNIQUE: Multidetector CT imaging of the head, cervical spine, and maxillofacial structures were performed using the standard protocol without intravenous contrast. Multiplanar CT image reconstructions of the cervical spine and maxillofacial structures were also generated. COMPARISON:  Head and cervical spine CT 08/13/2016. FINDINGS: CT  HEAD FINDINGS Brain: Confluent bilateral cerebral white matter hypodensity appears stable. Heterogeneity in the bilateral deep [place] matter nuclei appears stable. No midline shift, ventriculomegaly, mass effect, evidence of mass lesion, intracranial hemorrhage or evidence of cortically based acute infarction. Vascular: Calcified atherosclerosis at the skull base. No suspicious intracranial vascular hyperdensity. Skull: Calvarium stable and intact. Sinuses/Orbits: Stable bubbly opacity throughout the bilateral maxillary and sphenoid sinuses. Frontal and ethmoid sinuses remain clear. Tympanic cavities and mastoids remain clear. Other: No acute scalp soft tissue finding. CT MAXILLOFACIAL FINDINGS Osseous: Mild acute bilateral nasal bone fractures, series 13, image 56 on the right and image 54 on the left. Overlying nasal soft tissue swelling with mild subcutaneous gas. The bony nasal septum is also mildly fractured and deviated in 2 places on series 13, image 62 (compare to series 4, image 29 of the prior head CT, and also series 13, image 56 today to image 24 previously. Mandible intact. Carious right mandible and maxillary bicuspid tooth and molars. No zygoma fracture.  No maxilla fracture identified. Orbits: No orbital wall fracture. Orbits soft tissues appear stable and negative. Sinuses: Stable bubbly opacity in the bilateral maxillary and sphenoid sinuses. Small volume retained secretions in the nasal cavity. Soft tissues: Negative visualized noncontrast larynx, pharynx, parapharyngeal spaces, retropharyngeal space, sublingual space, submandibular glands and parotid glands. Limited intracranial: Reported above. CT CERVICAL SPINE FINDINGS Alignment: Stable straightening of lordosis. Skull base and vertebrae: Visualized skull base is intact. No atlanto-occipital dissociation. Bulky degenerative ligamentous hypertrophy about the odontoid with associated posterior odontoid erosion which is unchanged. Bilateral  posterior element alignment is within normal limits. Chronic ankylosis of the C4-C5 vertebral bodies and posterior elements. No acute cervical spine fracture identified. Soft tissues and spinal canal: Stable. Calcified carotid atherosclerosis. Disc levels: Diffuse severe chronic cervical disc and endplate degeneration. Multilevel chronic cervical degenerative spinal stenosis probably most pronounced at C3-C4. Upper chest: Grossly stable and intact visualized upper thoracic levels. Negative lung apices. Other: None. IMPRESSION: 1. Mild bilateral nasal bone fractures. Fracture and mild deviation of the bony nasal septum in 2 places. 2. Overlying nasal soft tissue swelling and mild subcutaneous gas. 3. No other acute facial fracture. 4.  No acute intracranial abnormality. 5. No acute fracture or listhesis identified in the cervical spine. Ligamentous injury is not excluded. 6. Severe widespread cervical spine degeneration appears stable. 7. Stable mild bubbly opacity in both maxillary and sphenoid sinuses. 8. Poor posterior dentition on the right. Electronically Signed   By: Odessa FlemingH  Hall M.D.   On: 08/21/2016 15:50   Ct Cervical Spine Wo Contrast  Result Date: 08/21/2016 CLINICAL DATA:  82 year old female status post fall at nursing home, found down with bloody nose. Initial encounter. EXAM: CT HEAD WITHOUT CONTRAST CT MAXILLOFACIAL WITHOUT CONTRAST CT CERVICAL SPINE WITHOUT CONTRAST TECHNIQUE: Multidetector CT imaging of the head, cervical spine, and maxillofacial structures were performed using the standard protocol without intravenous contrast. Multiplanar CT image reconstructions of the cervical spine and maxillofacial structures were also generated. COMPARISON:  Head and cervical spine CT 08/13/2016. FINDINGS: CT HEAD FINDINGS Brain: Confluent bilateral cerebral white matter hypodensity appears stable. Heterogeneity in the bilateral deep [place]z matter nuclei appears stable. No midline shift, ventriculomegaly, mass  effect, evidence of mass lesion, intracranial hemorrhage or evidence of cortically based acute infarction. Vascular: Calcified atherosclerosis at the skull base. No suspicious intracranial vascular hyperdensity. Skull: Calvarium stable and intact. Sinuses/Orbits: Stable bubbly opacity throughout the bilateral maxillary and sphenoid sinuses. Frontal and ethmoid sinuses remain clear. Tympanic cavities and mastoids remain clear. Other: No acute scalp soft tissue finding. CT MAXILLOFACIAL FINDINGS Osseous: Mild acute bilateral nasal bone fractures, series 13, image 56 on the right and image 54 on the left. Overlying nasal soft tissue swelling with mild subcutaneous gas. The bony nasal septum is also mildly fractured and deviated in 2 places on series 13, image 62 (compare to series 4, image 29 of the prior head CT, and also series 13, image 56 today to image 24 previously. Mandible intact. Carious right mandible and maxillary bicuspid tooth and molars. No zygoma fracture. No maxilla fracture identified. Orbits: No orbital wall fracture. Orbits soft tissues appear stable and negative. Sinuses: Stable bubbly opacity in the bilateral maxillary and sphenoid sinuses. Small volume retained secretions in the nasal cavity. Soft tissues: Negative visualized noncontrast larynx, pharynx, parapharyngeal spaces, retropharyngeal space, sublingual space, submandibular glands and parotid glands. Limited intracranial: Reported above. CT CERVICAL SPINE FINDINGS Alignment: Stable straightening of lordosis. Skull base and vertebrae: Visualized skull base is intact. No atlanto-occipital dissociation.  Bulky degenerative ligamentous hypertrophy about the odontoid with associated posterior odontoid erosion which is unchanged. Bilateral posterior element alignment is within normal limits. Chronic ankylosis of the C4-C5 vertebral bodies and posterior elements. No acute cervical spine fracture identified. Soft tissues and spinal canal: Stable.  Calcified carotid atherosclerosis. Disc levels: Diffuse severe chronic cervical disc and endplate degeneration. Multilevel chronic cervical degenerative spinal stenosis probably most pronounced at C3-C4. Upper chest: Grossly stable and intact visualized upper thoracic levels. Negative lung apices. Other: None. IMPRESSION: 1. Mild bilateral nasal bone fractures. Fracture and mild deviation of the bony nasal septum in 2 places. 2. Overlying nasal soft tissue swelling and mild subcutaneous gas. 3. No other acute facial fracture. 4.  No acute intracranial abnormality. 5. No acute fracture or listhesis identified in the cervical spine. Ligamentous injury is not excluded. 6. Severe widespread cervical spine degeneration appears stable. 7. Stable mild bubbly opacity in both maxillary and sphenoid sinuses. 8. Poor posterior dentition on the right. Electronically Signed   By: Odessa Fleming M.D.   On: 08/21/2016 15:50   Ct Maxillofacial Wo Contrast  Result Date: 08/21/2016 CLINICAL DATA:  82 year old female status post fall at nursing home, found down with bloody nose. Initial encounter. EXAM: CT HEAD WITHOUT CONTRAST CT MAXILLOFACIAL WITHOUT CONTRAST CT CERVICAL SPINE WITHOUT CONTRAST TECHNIQUE: Multidetector CT imaging of the head, cervical spine, and maxillofacial structures were performed using the standard protocol without intravenous contrast. Multiplanar CT image reconstructions of the cervical spine and maxillofacial structures were also generated. COMPARISON:  Head and cervical spine CT 08/13/2016. FINDINGS: CT HEAD FINDINGS Brain: Confluent bilateral cerebral white matter hypodensity appears stable. Heterogeneity in the bilateral deep [place] matter nuclei appears stable. No midline shift, ventriculomegaly, mass effect, evidence of mass lesion, intracranial hemorrhage or evidence of cortically based acute infarction. Vascular: Calcified atherosclerosis at the skull base. No suspicious intracranial vascular hyperdensity.  Skull: Calvarium stable and intact. Sinuses/Orbits: Stable bubbly opacity throughout the bilateral maxillary and sphenoid sinuses. Frontal and ethmoid sinuses remain clear. Tympanic cavities and mastoids remain clear. Other: No acute scalp soft tissue finding. CT MAXILLOFACIAL FINDINGS Osseous: Mild acute bilateral nasal bone fractures, series 13, image 56 on the right and image 54 on the left. Overlying nasal soft tissue swelling with mild subcutaneous gas. The bony nasal septum is also mildly fractured and deviated in 2 places on series 13, image 62 (compare to series 4, image 29 of the prior head CT, and also series 13, image 56 today to image 24 previously. Mandible intact. Carious right mandible and maxillary bicuspid tooth and molars. No zygoma fracture. No maxilla fracture identified. Orbits: No orbital wall fracture. Orbits soft tissues appear stable and negative. Sinuses: Stable bubbly opacity in the bilateral maxillary and sphenoid sinuses. Small volume retained secretions in the nasal cavity. Soft tissues: Negative visualized noncontrast larynx, pharynx, parapharyngeal spaces, retropharyngeal space, sublingual space, submandibular glands and parotid glands. Limited intracranial: Reported above. CT CERVICAL SPINE FINDINGS Alignment: Stable straightening of lordosis. Skull base and vertebrae: Visualized skull base is intact. No atlanto-occipital dissociation. Bulky degenerative ligamentous hypertrophy about the odontoid with associated posterior odontoid erosion which is unchanged. Bilateral posterior element alignment is within normal limits. Chronic ankylosis of the C4-C5 vertebral bodies and posterior elements. No acute cervical spine fracture identified. Soft tissues and spinal canal: Stable. Calcified carotid atherosclerosis. Disc levels: Diffuse severe chronic cervical disc and endplate degeneration. Multilevel chronic cervical degenerative spinal stenosis probably most pronounced at C3-C4. Upper  chest: Grossly stable and intact visualized upper thoracic  levels. Negative lung apices. Other: None. IMPRESSION: 1. Mild bilateral nasal bone fractures. Fracture and mild deviation of the bony nasal septum in 2 places. 2. Overlying nasal soft tissue swelling and mild subcutaneous gas. 3. No other acute facial fracture. 4.  No acute intracranial abnormality. 5. No acute fracture or listhesis identified in the cervical spine. Ligamentous injury is not excluded. 6. Severe widespread cervical spine degeneration appears stable. 7. Stable mild bubbly opacity in both maxillary and sphenoid sinuses. 8. Poor posterior dentition on the right. Electronically Signed   By: Odessa Fleming M.D.   On: 08/21/2016 15:50    Procedures Procedures (including critical care time)  Medications Ordered in ED Medications - No data to display   Initial Impression / Assessment and Plan / ED Course  I have reviewed the triage vital signs and the nursing notes.  Pertinent labs & imaging results that were available during my care of the patient were reviewed by me and considered in my medical decision making (see chart for details).  Clinical Course    Patient presents to the ED after a fall.  She has bleeding in her nose which is now resolved and TTP of the nose.  Likely broken.  Will obtain CT for evaluation.  She currently does not want anything for pain control.  4:06 PM CT reveals bilateral nasal bone fractures.  ENT fu provided.  Patient informed of diagnosis (seemed to understand despite documented h/o dementia).  She was instructed not to blow her nose or sneeze heavily.  She appears well and in NAD. VS remain within her normal limits and she is safe for DC.  Final Clinical Impressions(s) / ED Diagnoses   Final diagnoses:  None      I personally performed the services described in this documentation, which was scribed in my presence. The recorded information has been reviewed and is accurate.     New  Prescriptions New Prescriptions   No medications on file       Tomasita Crumble, MD 08/21/16 2134

## 2016-08-21 NOTE — ED Notes (Signed)
[facility] [place] been called [place] [facility] [place]

## 2016-08-21 NOTE — ED Notes (Signed)
Pt. Has no noted difficulty breathing and no drooling from her mouth  Pt. Being discharged back to [facility].

## 2016-11-01 ENCOUNTER — Emergency Department (HOSPITAL_BASED_OUTPATIENT_CLINIC_OR_DEPARTMENT_OTHER): Payer: Medicare Other

## 2016-11-01 ENCOUNTER — Encounter (HOSPITAL_BASED_OUTPATIENT_CLINIC_OR_DEPARTMENT_OTHER): Payer: Self-pay | Admitting: Emergency Medicine

## 2016-11-01 ENCOUNTER — Emergency Department (HOSPITAL_BASED_OUTPATIENT_CLINIC_OR_DEPARTMENT_OTHER)
Admission: EM | Admit: 2016-11-01 | Discharge: 2016-11-01 | Disposition: A | Discharge status: Home / Self Care | Payer: Medicare Other | Attending: Emergency Medicine | Admitting: Emergency Medicine

## 2016-11-01 DIAGNOSIS — F0390 Unspecified dementia without behavioral disturbance: Secondary | ICD-10-CM | POA: Diagnosis not present

## 2016-11-01 DIAGNOSIS — I10 Essential (primary) hypertension: Secondary | ICD-10-CM | POA: Insufficient documentation

## 2016-11-01 DIAGNOSIS — W01198A Fall on same level from slipping, tripping and stumbling with subsequent striking against other object, initial encounter: Secondary | ICD-10-CM | POA: Diagnosis not present

## 2016-11-01 DIAGNOSIS — Y939 Activity, unspecified: Secondary | ICD-10-CM | POA: Insufficient documentation

## 2016-11-01 DIAGNOSIS — Y929 Unspecified place or not applicable: Secondary | ICD-10-CM | POA: Diagnosis not present

## 2016-11-01 DIAGNOSIS — W19XXXA Unspecified fall, initial encounter: Secondary | ICD-10-CM

## 2016-11-01 DIAGNOSIS — I714 Abdominal aortic aneurysm, without rupture: Secondary | ICD-10-CM | POA: Diagnosis not present

## 2016-11-01 DIAGNOSIS — Y999 Unspecified external cause status: Secondary | ICD-10-CM | POA: Insufficient documentation

## 2016-11-01 DIAGNOSIS — S0990XA Unspecified injury of head, initial encounter: Secondary | ICD-10-CM | POA: Diagnosis present

## 2016-11-01 HISTORY — DX: Gastro-esophageal reflux disease without esophagitis: K21.9

## 2016-11-01 HISTORY — DX: Major depressive disorder, single episode, unspecified: F32.9

## 2016-11-01 HISTORY — DX: Depression, unspecified: F32.A

## 2016-11-01 LAB — URINALYSIS, ROUTINE W REFLEX MICROSCOPIC
Bilirubin Urine: NEGATIVE
GLUCOSE, UA: NEGATIVE mg/dL
Ketones, ur: NEGATIVE mg/dL
LEUKOCYTES UA: NEGATIVE
Nitrite: NEGATIVE
PH: 6 (ref 5.0–8.0)
PROTEIN: 100 mg/dL — AB
SPECIFIC GRAVITY, URINE: 1.014 (ref 1.005–1.030)

## 2016-11-01 LAB — URINALYSIS, MICROSCOPIC (REFLEX)

## 2016-11-01 NOTE — ED Notes (Signed)
Pt assisted to bathroom x 2 assists. Pt returned to [employer]-chair and placed back at nsg station.

## 2016-11-01 NOTE — ED Notes (Signed)
Pt found on floor on arrival to room. Pt does not answer questions appropriately as per her normal.  Left hip skin is red, no broken skin. No obvious injury noted. Will make MD aware.

## 2016-11-01 NOTE — ED Notes (Signed)
Pt transported to CT and x-ray  

## 2016-11-01 NOTE — ED Notes (Signed)
Spoke with Catering manager[employer] director [facility] [employer] and explained what the Social worker relayed to this Charity fundraiserN. Administrator was made aware that we would not be cancelling [facility]. Phone number of ED given for follow up as needed

## 2016-11-01 NOTE — ED Provider Notes (Addendum)
[facility] Provider Note   CSN: [id number] Arrival date & time: 11/01/16  1345     History   Chief Complaint Chief Complaint  Patient presents with  . Fall    HPI Carly Wilkinson is a 82 y.o. female.  Patient brought in from nursing facility. Patient has a history of dementia and is baseline. Patient slipped on ice on the floor and fell hitting her head on the door during the fall. No loss of consciousness. Patient not on blood thinners. Patient denies any pain but she has a history of dementia. Facility reported tenderness to her right buttock area.      Past Medical History:  Diagnosis Date  . Dementia   . Depression   . GERD (gastroesophageal reflux disease)   . Hypertension     There are no active problems to display for this patient.   History reviewed. No pertinent surgical history.  OB History    No data available       Home Medications    Prior to Admission medications   Medication Sig Start Date End Date Taking? Authorizing Provider  escitalopram (LEXAPRO) 5 MG tablet Take 5 mg by mouth daily.   Yes Historical Provider, MD  guaiFENesin (ROBITUSSIN) 100 MG/5ML SOLN Take 5 mLs by mouth every 4 (four) hours as needed for cough or to loosen phlegm.   Yes Historical Provider, MD  levETIRAcetam (KEPPRA) 250 MG tablet Take 250 mg by mouth 2 (two) times daily.   Yes Historical Provider, MD  pindolol (VISKEN) 5 MG tablet Take 2.5 mg by mouth 2 (two) times daily.   Yes Historical Provider, MD  ranitidine (ZANTAC) 75 MG tablet Take 75 mg by mouth 2 (two) times daily.   Yes Historical Provider, MD  acetaminophen (TYLENOL) 325 MG tablet Take 650 mg by mouth every 4 (four) hours as needed.    Historical Provider, MD  amLODipine-benazepril (LOTREL) 5-10 MG capsule Take 1 capsule by mouth daily.    Historical Provider, MD  aspirin EC 81 MG tablet Take 81 mg by mouth daily.    Historical Provider, MD  calcium citrate-vitamin D (CITRACAL+D) 315-200 MG-UNIT tablet  Take 1 tablet by mouth 2 (two) times daily.    Historical Provider, MD  donepezil (ARICEPT) 10 MG tablet Take 10 mg by mouth every morning.    Historical Provider, MD  ipratropium-albuterol (DUONEB) 0.5-2.5 (3) MG/3ML SOLN Take 3 mLs by nebulization every 8 (eight) hours as needed.    Historical Provider, MD  levofloxacin (LEVAQUIN) 500 MG tablet Take 500 mg by mouth daily.    Historical Provider, MD  loratadine (CLARITIN) 10 MG tablet Take 10 mg by mouth daily.    Historical Provider, MD  memantine (NAMENDA) 10 MG tablet Take 10 mg by mouth 2 (two) times daily.    Historical Provider, MD  Multiple Vitamin (MULITIVITAMIN WITH MINERALS) TABS Take 1 tablet by mouth daily.    Historical Provider, MD  Multiple Vitamins-Iron (MULTIVITAMINS WITH IRON) TABS tablet Take 1 tablet by mouth daily.    Historical Provider, MD  neomycin-polymyxin b-dexamethasone (MAXITROL) 3.5-10000-0.1 OINT 1 application.    Historical Provider, MD  omeprazole (PRILOSEC) 10 MG capsule Take 10 mg by mouth daily.    Historical Provider, MD    Family History No family history on file.  Social History Social History  Substance Use Topics  . Smoking status: Unknown If Ever Smoked  . Smokeless tobacco: Not on file  . Alcohol use No  Allergies   Patient has no known allergies.   Review of Systems Review of Systems  Unable to perform ROS: Dementia     Physical Exam Updated Vital Signs BP (!) 213/94 (BP Location: Right Arm)   Pulse 63   Temp 97.6 F (36.4 C) (Oral)   Resp 20   SpO2 95%   Physical Exam  Constitutional: She appears well-developed and well-nourished. No distress.  HENT:  Head: Normocephalic and atraumatic.  Eyes: Conjunctivae and EOM are normal. Pupils are equal, round, and reactive to light.  Neck: Neck supple.  Cardiovascular: Normal rate, regular rhythm and normal heart sounds.   Pulmonary/Chest: Effort normal and breath sounds normal. No respiratory distress.  Abdominal: Soft. Bowel  sounds are normal. There is no tenderness.  Musculoskeletal: Normal range of motion. She exhibits no edema or deformity.  Neurological: She is alert. No cranial nerve deficit or sensory deficit. She exhibits normal muscle tone.  Skin: Skin is warm.  Nursing note and vitals reviewed.    ED Treatments / Results  Labs (all labs ordered are listed, but only abnormal results are displayed) Labs Reviewed - No data to display  EKG  EKG Interpretation None       Radiology No results found.  Procedures Procedures (including critical care time)  Medications Ordered in ED Medications - No data to display   Initial Impression / Assessment and Plan / ED Course  I have reviewed the triage vital signs and the nursing notes.  Pertinent labs & imaging results that were available during my care of the patient were reviewed by me and considered in my medical decision making (see chart for details).  Clinical Course     Due to the fall will get CT head and neck. EMS has her neck wrapped with a towel. However patient is moving her head all over the place. Patient has dementia according nursing facility baseline. Patient denies any fall or any problem at all. No obvious deformity to the legs or hip area. However nursing facility reported that patient had the pain to her right buttock area.  Based on this will get CT head neck and get x-rays of the pelvis and bilateral hips. If negative patient can return back to facility.  Final Clinical Impressions(s) / ED Diagnoses   Final diagnoses:  Fall, initial encounter    New Prescriptions New Prescriptions   No medications on file     Vanetta MuldersScott Kena Limon, MD 11/01/16 1444  Correction radiology prefers to do CT of pelvis which will include both hips since the plain films. This is okay by me. It is more accurate.    Vanetta MuldersScott Coen Miyasato, MD 11/01/16 1451

## 2016-11-01 NOTE — Discharge Instructions (Signed)
CT scan of head and neck and x-rays of the pelvis and both hips without evidence of any acute injuries. Patient stable for discharge back to nursing facility.

## 2016-11-01 NOTE — ED Provider Notes (Signed)
Patient was evaluated by previous ED physician for fall and discharged home. While waiting for transport patient fell in her room. No obvious head or neck injury. Was initially complaining of left hip pain. She has full range of motion of the left hip with good distal pulses intact x-ray without evidence of injury. She is at her baseline mental status. Do not believe that repeat CT is necessary. We'll discharge back to nursing home.   Loren Raceravid Heiley Shaikh, MD 11/01/16 [phone]

## 2016-11-01 NOTE — ED Notes (Signed)
[facility] called for transport.  

## 2016-11-01 NOTE — ED Notes (Signed)
[facility] called again to reschedule pick up due to UA being obtained. Advised by dispatch Patient would be entered as a 17:30 transport.

## 2016-11-01 NOTE — ED Notes (Signed)
Director from [facility] called and said that they could not take the pt back because she had fallen 6 times and last 5 days,  Director was told pt had been cleared and was just waiting transportation to return he home  Stated that they did not have staff to tend to her,  Rozanna BoxCheryl somers was called and notified, who suggested to call [facility],  Wellsite geologistkeli connor then called [facility]

## 2016-11-01 NOTE — ED Notes (Addendum)
Pt given micro meal  Ate 100% of meal

## 2016-11-01 NOTE — ED Triage Notes (Signed)
Presents via EMS, witnessed fall landing on wood floor. Pt slipped on ice. Pt did hit her head on the door during the fall. Denies LOC. No blood thinner use. Pt with hx of dementia and is at baseline. Pt with tenderness to R buttock area.

## 2016-11-01 NOTE — ED Notes (Signed)
Pt given frozen dinner. Pt ate 95% of meal with assistance.

## 2016-11-01 NOTE — ED Notes (Signed)
[facility] here for transport. 

## 2017-04-26 IMAGING — CT CT CERVICAL SPINE W/O CM
3 series · 14 of 33 positions shown, 17 images · non-contrast
Comparison: 08/21/2016

ADDENDUM:
The patient has returned for further imaging to cover these
skullbase. Diffuse atrophic and chronic white matter ischemic
changes are again identified. No findings to suggest acute
hemorrhage, acute infarction or space-occupying mass lesion are
noted. No acute bony abnormality is seen.
CLINICAL DATA: Recent fall under would floor with headaches and
neck pain, initial encounter

EXAM:
CT HEAD WITHOUT CONTRAST
CT CERVICAL SPINE WITHOUT CONTRAST
TECHNIQUE: Multidetector CT imaging of the head and cervical spine was
performed following the standard protocol without intravenous
contrast. Multiplanar CT image reconstructions of the cervical spine
were also generated. Examinations are significantly limited by
patient motion artifact.

[Series 2: head wo · axial · 0.40mm/px · z∈[+452,+562]mm · 6 of 30 slices shown, 8 images]
[im 5/30  soft-tissue]
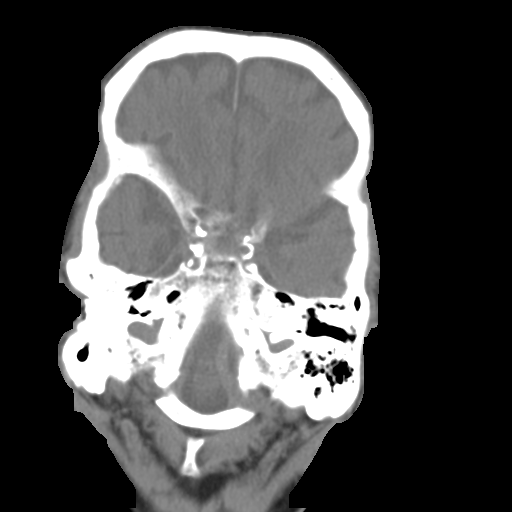
[im 5/30  bone]
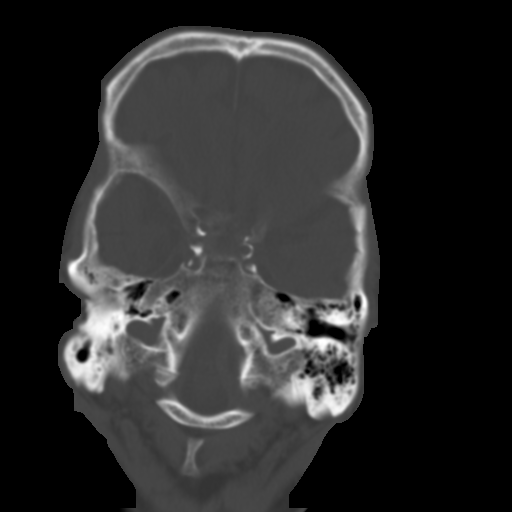
[im 9/30  bone]
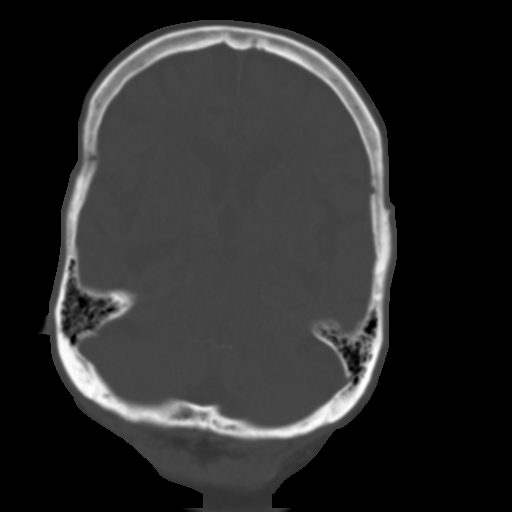
[im 14/30  bone]
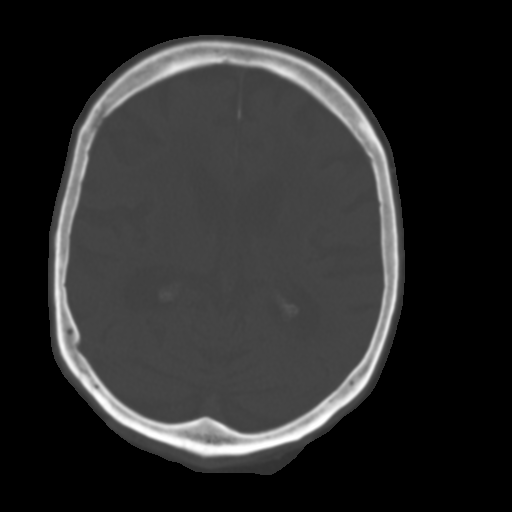
[im 18/30  bone]
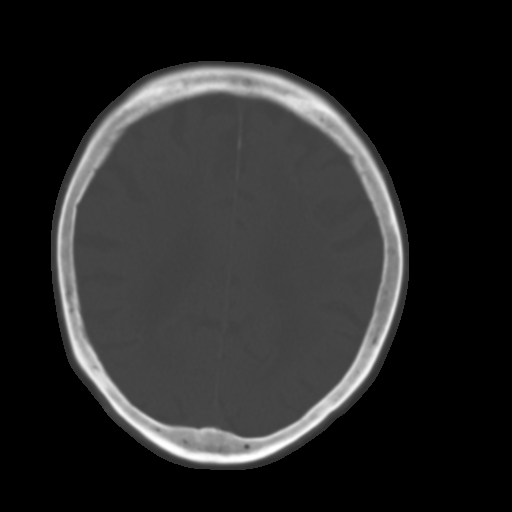
[im 23/30  soft-tissue]
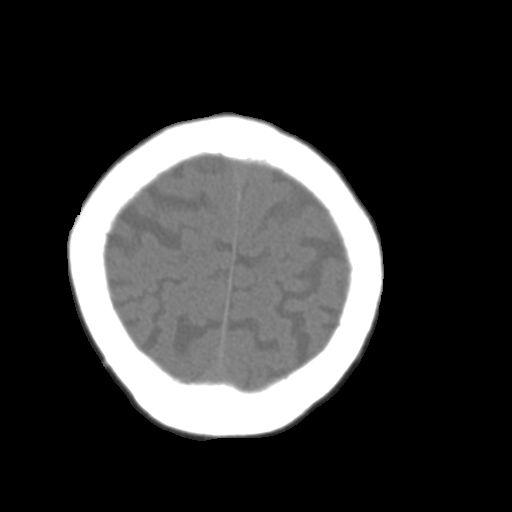
[im 23/30  bone]
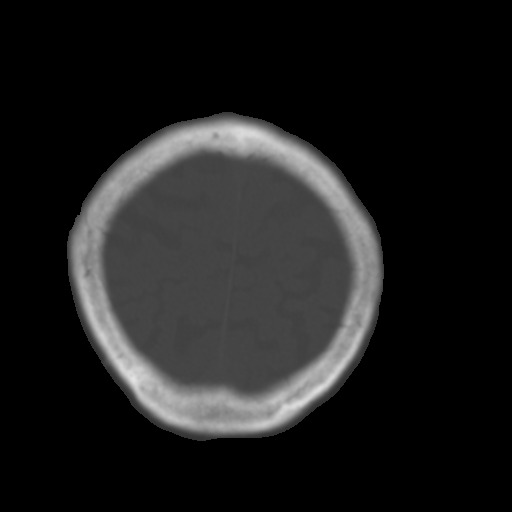
[im 27/30  bone]
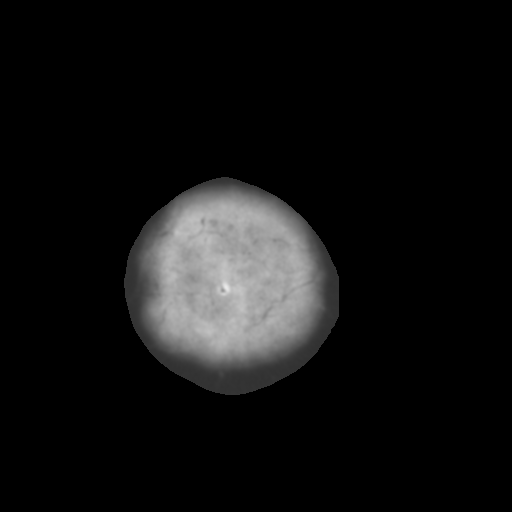

[Series 4: cor soft · coronal · 0.30mm/px · 3 of 64 slices shown]
[im 13/64  bone]
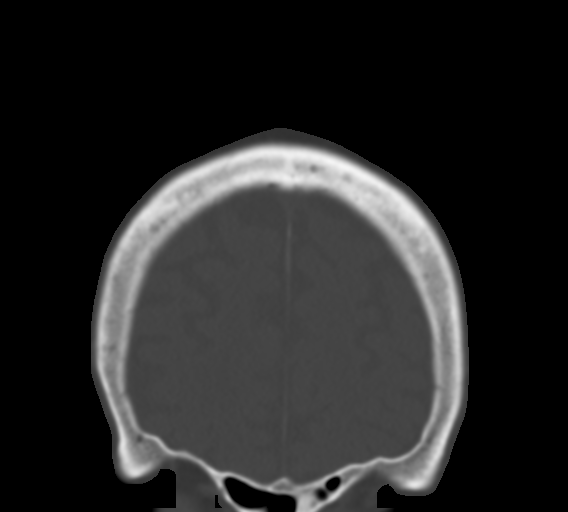
[im 26/64  bone]
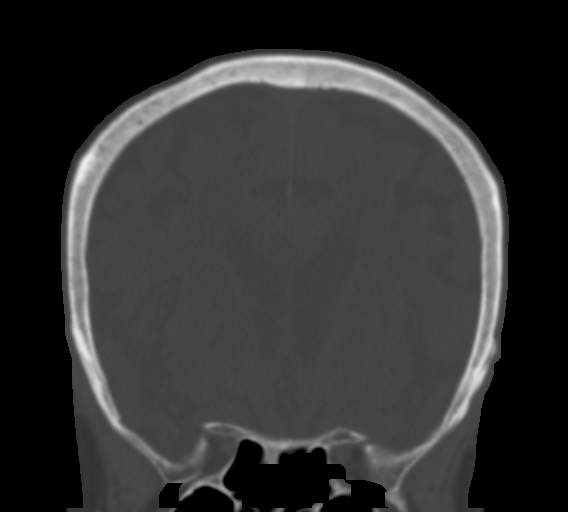
[im 38/64  bone]
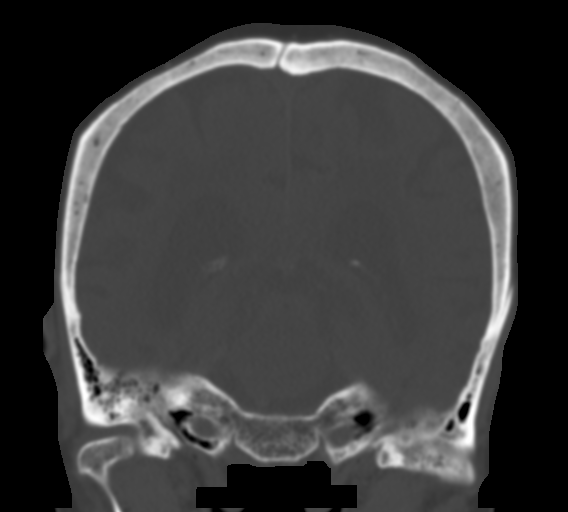

[Series 5: sag soft · sagittal · 0.31mm/px · 5 of 53 slices shown, 6 images]
[im 18/53  bone]
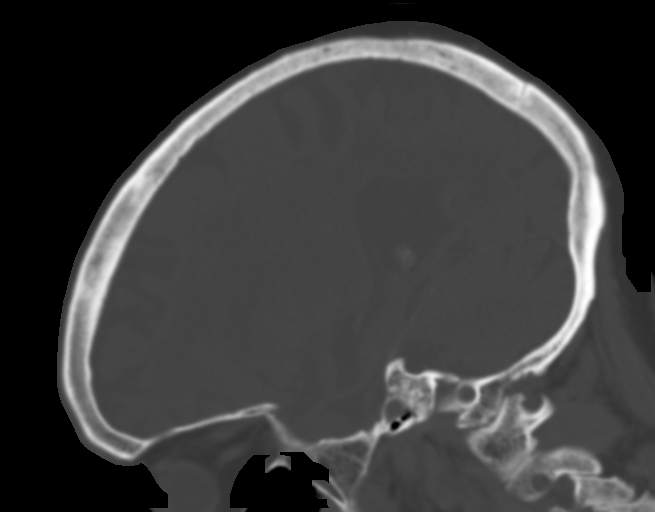
[im 22/53  bone]
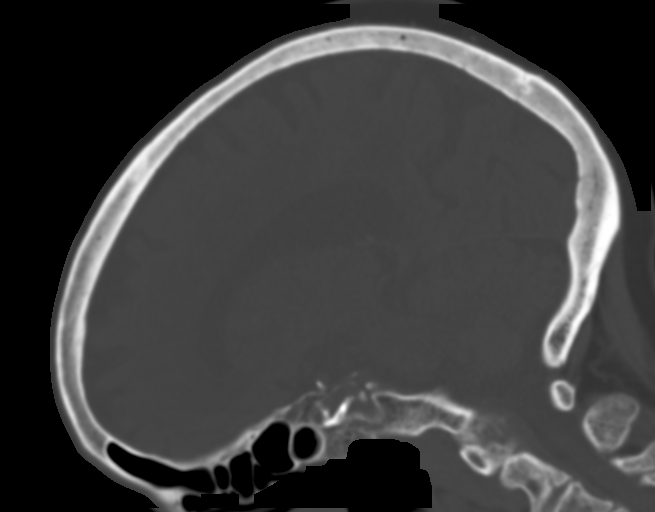
[im 27/53  soft-tissue]
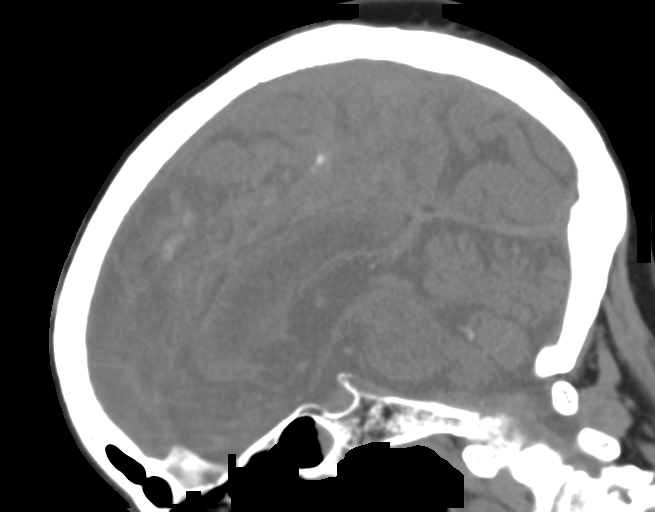
[im 27/53  bone]
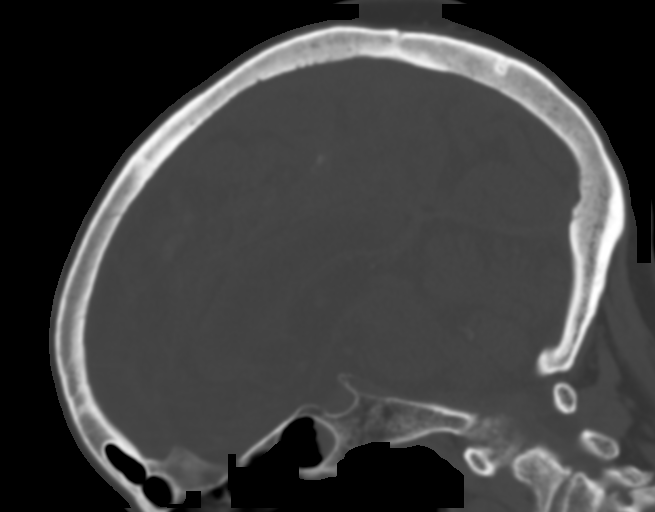
[im 31/53  bone]
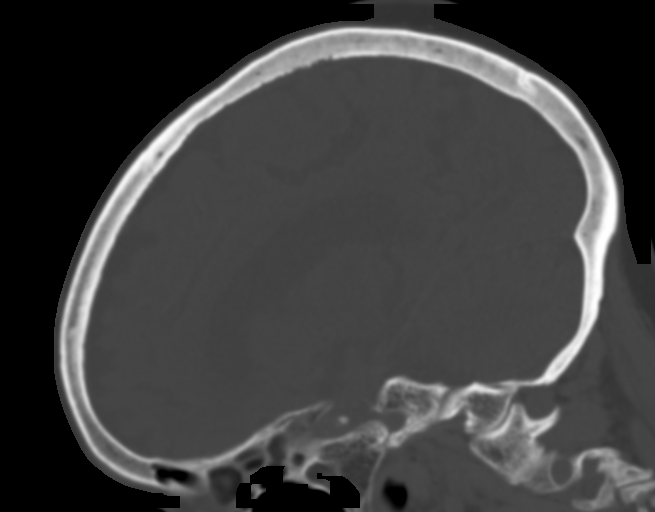
[im 35/53  bone]
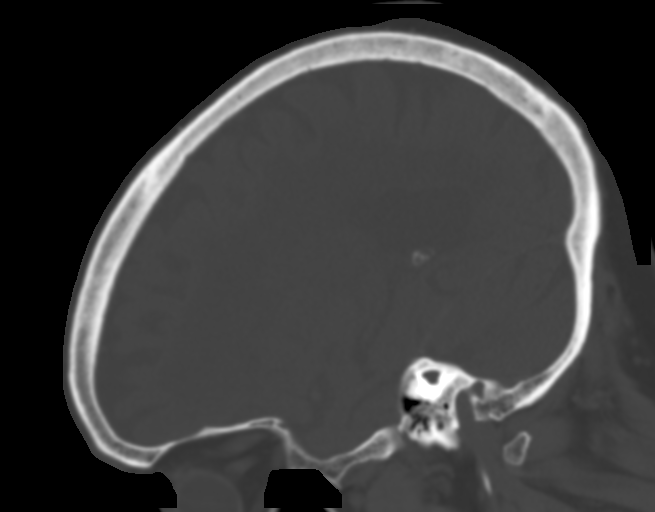

[14 of 33 positions shown; findings below may reference images not displayed]

FINDINGS: CT HEAD FINDINGS

Brain: Atrophic changes and changes of chronic white matter ischemic
change are noted. No acute intracranial abnormality is seen although
the examination is somewhat limited by patient motion artifact as
well as inability to cover the skullbase adequately. A portion of
this is covered on the cervical spine and within normal limits.

Vascular: No hyperdense vessel or unexpected calcification.

Skull: Normal. Negative for fracture or focal lesion.

Sinuses/Orbits: No acute finding.

Other: None.

CT CERVICAL SPINE FINDINGS

Alignment: Within normal limits.

Skull base and vertebrae: 7 cervical segments are well visualized.
Disc space narrowing is noted at all cervical levels with
significant anterior and posterior osteophytes. These changes extend
into the upper thoracic spine involving T2 and T1.

Soft tissues and spinal canal: Carotid calcifications are seen. No
acute soft tissue abnormality is noted. Multilevel spinal canal
narrowing is noted.

Disc levels: Disc space narrowing is noted throughout the cervical
spine.

Upper chest: Within normal limits.
IMPRESSION: CT of the head: Chronic atrophic and ischemic changes without acute
abnormality although the examination is extremely limited.

CT of the cervical spine: Multilevel degenerative changes without
evidence of acute abnormality

## 2018-03-27 DEATH — deceased
